# Patient Record
Sex: Female | Born: 1985 | Race: Black or African American | Hispanic: No | Marital: Single | Smoking: Never smoker
Health system: Southern US, Community
[De-identification: ages and names within clinical notes are randomized; demographics above are authoritative.]

## PROBLEM LIST (undated history)

## (undated) DIAGNOSIS — L309 Dermatitis, unspecified: Secondary | ICD-10-CM

## (undated) DIAGNOSIS — J45909 Unspecified asthma, uncomplicated: Secondary | ICD-10-CM

## (undated) HISTORY — PX: LEG SURGERY: SHX1003

## (undated) HISTORY — DX: Dermatitis, unspecified: L30.9

## (undated) HISTORY — DX: Unspecified asthma, uncomplicated: J45.909

---

## 2014-02-07 ENCOUNTER — Emergency Department (HOSPITAL_COMMUNITY)
Admission: EM | Admit: 2014-02-07 | Discharge: 2014-02-07 | Disposition: A | Discharge status: Home / Self Care | Payer: No Typology Code available for payment source | Attending: Emergency Medicine | Admitting: Emergency Medicine

## 2014-02-07 ENCOUNTER — Emergency Department (INDEPENDENT_AMBULATORY_CARE_PROVIDER_SITE_OTHER)
Admission: EM | Admit: 2014-02-07 | Discharge: 2014-02-07 | Disposition: A | Discharge status: Nursing Home (Includes ICF) | Payer: Self-pay | Source: Home / Self Care | Attending: Emergency Medicine | Admitting: Emergency Medicine

## 2014-02-07 ENCOUNTER — Emergency Department (HOSPITAL_COMMUNITY): Payer: No Typology Code available for payment source

## 2014-02-07 ENCOUNTER — Encounter (HOSPITAL_COMMUNITY): Payer: Self-pay | Admitting: Emergency Medicine

## 2014-02-07 DIAGNOSIS — R001 Bradycardia, unspecified: Secondary | ICD-10-CM

## 2014-02-07 DIAGNOSIS — R51 Headache: Secondary | ICD-10-CM

## 2014-02-07 DIAGNOSIS — S1093XA Contusion of unspecified part of neck, initial encounter: Principal | ICD-10-CM

## 2014-02-07 DIAGNOSIS — R110 Nausea: Secondary | ICD-10-CM | POA: Insufficient documentation

## 2014-02-07 DIAGNOSIS — R519 Headache, unspecified: Secondary | ICD-10-CM

## 2014-02-07 DIAGNOSIS — IMO0001 Reserved for inherently not codable concepts without codable children: Secondary | ICD-10-CM | POA: Insufficient documentation

## 2014-02-07 DIAGNOSIS — S0083XA Contusion of other part of head, initial encounter: Principal | ICD-10-CM | POA: Insufficient documentation

## 2014-02-07 DIAGNOSIS — Y939 Activity, unspecified: Secondary | ICD-10-CM | POA: Insufficient documentation

## 2014-02-07 DIAGNOSIS — Y92410 Unspecified street and highway as the place of occurrence of the external cause: Secondary | ICD-10-CM

## 2014-02-07 DIAGNOSIS — S0003XA Contusion of scalp, initial encounter: Secondary | ICD-10-CM | POA: Insufficient documentation

## 2014-02-07 DIAGNOSIS — S0093XA Contusion of unspecified part of head, initial encounter: Secondary | ICD-10-CM

## 2014-02-07 MED ORDER — HYDROCODONE-ACETAMINOPHEN 5-325 MG PO TABS: 1.0000 | ORAL_TABLET | ORAL | Status: AC | PRN

## 2014-02-07 MED ORDER — ONDANSETRON 4 MG PO TBDP
8.0000 mg | ORAL_TABLET | Freq: Once | ORAL | Status: AC
  Administered 2014-02-07: 8 mg via ORAL

## 2014-02-07 MED ORDER — ONDANSETRON 4 MG PO TBDP
ORAL_TABLET | ORAL | Status: AC
  Filled 2014-02-07: qty 2

## 2014-02-07 MED ORDER — NAPROXEN 375 MG PO TABS: 375.0000 mg | ORAL_TABLET | Freq: Two times a day (BID) | ORAL | Status: AC

## 2014-02-07 MED ORDER — CYCLOBENZAPRINE HCL 10 MG PO TABS: 10.0000 mg | ORAL_TABLET | Freq: Two times a day (BID) | ORAL | Status: AC | PRN

## 2014-02-07 NOTE — ED Notes (Signed)
Pt. Ambulated in hall and pulse rechecked. Pulse of 63.
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
## 2014-02-07 NOTE — Discharge Instructions (Signed)
We have determined that your problem requires further evaluation in the emergency department.  We will take care of your transport there.  Once at the emergency department, you will be evaluated by a provider and they will order whatever treatment or tests they deem necessary.  We cannot guarantee that they will do any specific test or do any specific treatment.  ° °
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
## 2014-02-08 NOTE — ED Provider Notes (Signed)
Medical screening examination/treatment/procedure(s) were performed by non-physician practitioner and as supervising physician I was immediately available for consultation/collaboration.   EKG Interpretation None        Esaias Cleavenger B. Indiyah Paone, MD 02/08/14 1542 

## 2014-02-08 NOTE — Progress Notes (Signed)
CM [place] from [place], pharmacists at [facility] on [address], requesting clarification on patient prescriptions [facility]. Read the discharge prescriptions off AVS from [facility]. Ferdinand CavaAndrea Schettino, RN, BSN, Case Managers 02/08/2014 10:29 AM

## 2014-04-17 ENCOUNTER — Ambulatory Visit: Payer: BC Managed Care – PPO | Attending: Orthopaedic Surgery | Admitting: Rehabilitation

## 2014-04-17 DIAGNOSIS — M545 Low back pain, unspecified: Secondary | ICD-10-CM | POA: Diagnosis not present

## 2014-04-17 DIAGNOSIS — IMO0001 Reserved for inherently not codable concepts without codable children: Secondary | ICD-10-CM | POA: Diagnosis present

## 2014-08-11 IMAGING — CT CT HEAD W/O CM
1 series · 16 of 30 positions shown, 20 images · non-contrast
Comparison: None.

HEADACHE CLINICAL DATA:
Trauma

EXAM:
CT HEAD WITHOUT CONTRAST
TECHNIQUE: Contiguous axial images were obtained from the base of the skull
through the vertex without intravenous contrast.

[Series 2: head 5.0 h30s · axial · 0.42mm/px · z∈[+1371,+1506]mm · 16 of 31 slices shown, 20 images]
[im 2/31  brain]
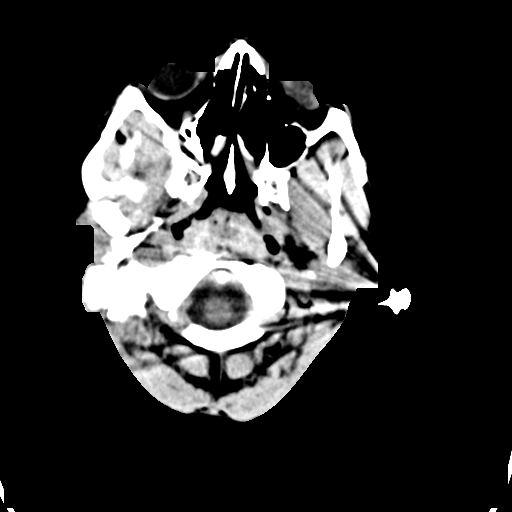
[im 2/31  bone]
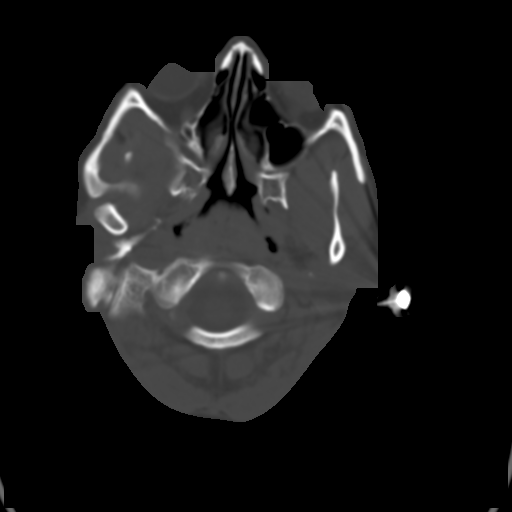
[im 4/31  brain]
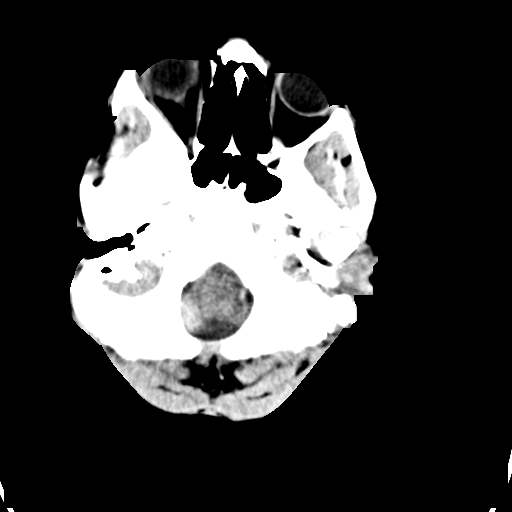
[im 6/31  brain]
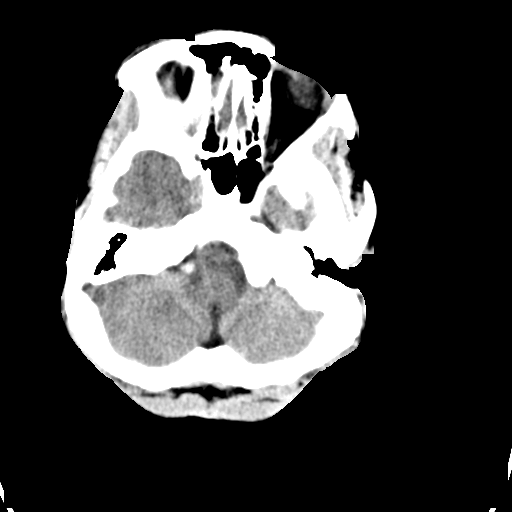
[im 8/31  brain]
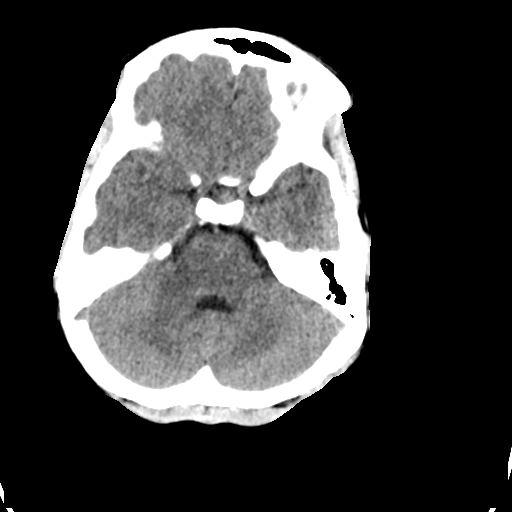
[im 9/31  brain]
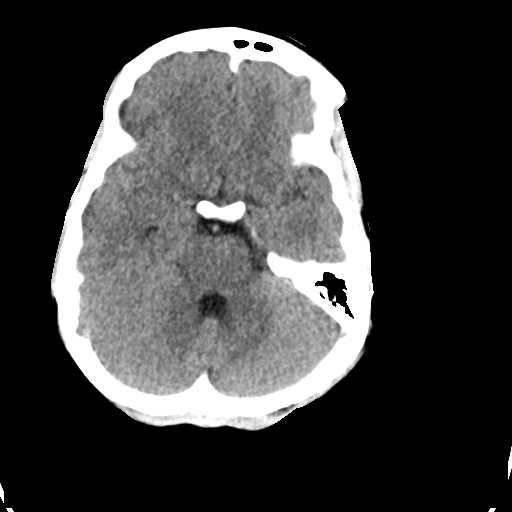
[im 9/31  bone]
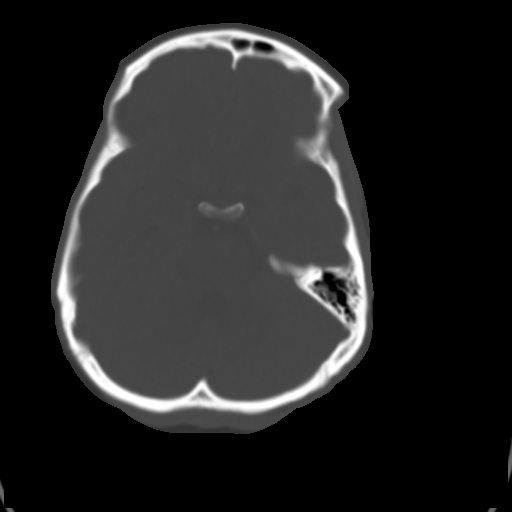
[im 11/31  brain]
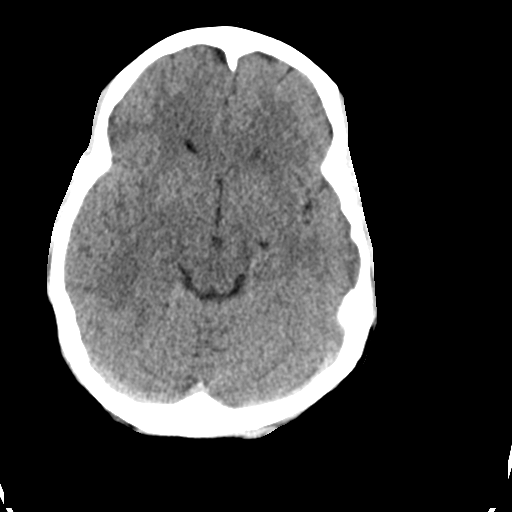
[im 13/31  brain]
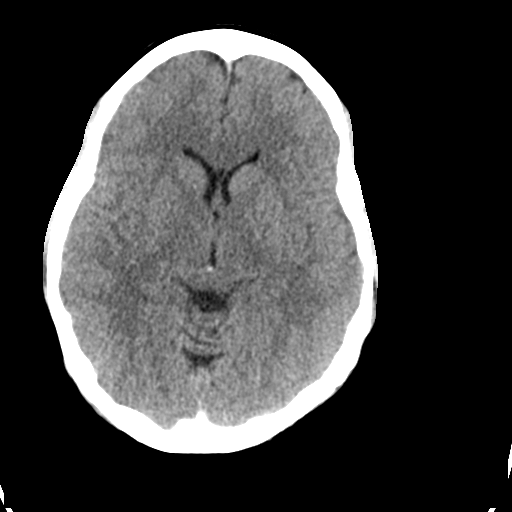
[im 15/31  brain]
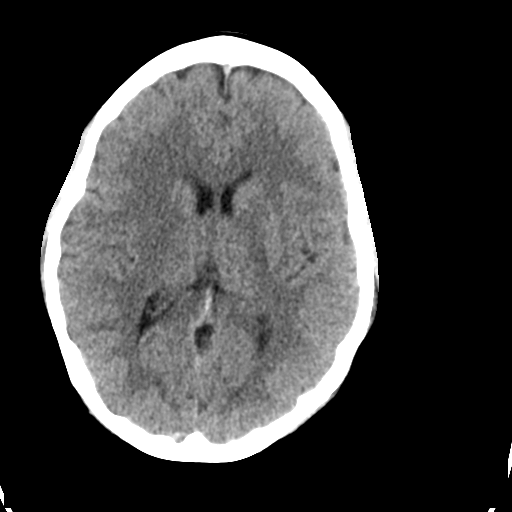
[im 16/31  brain]
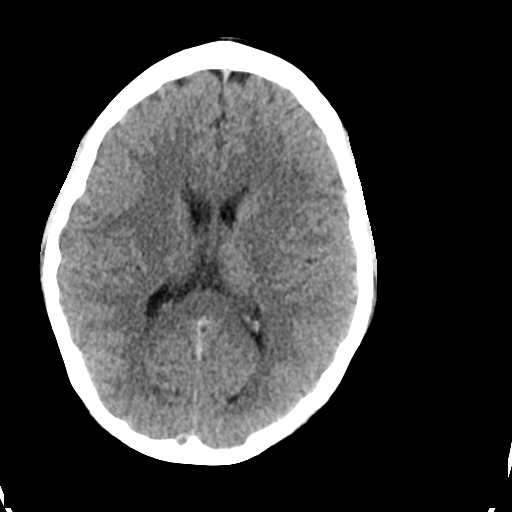
[im 16/31  bone]
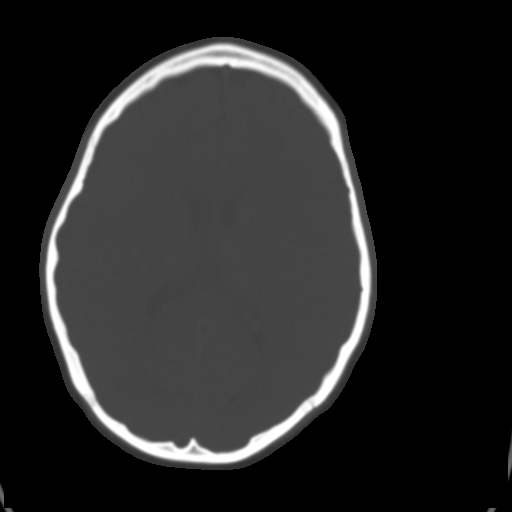
[im 18/31  brain]
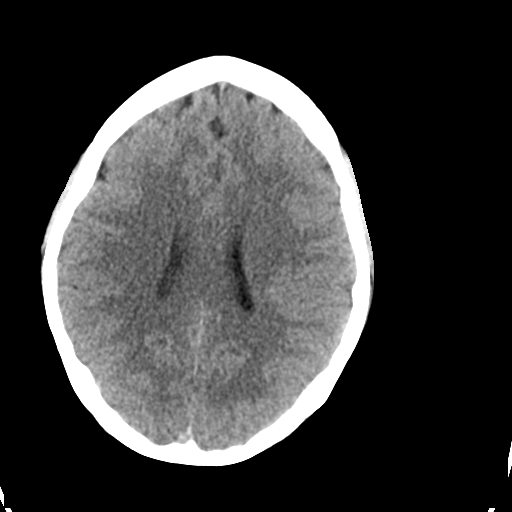
[im 20/31  brain]
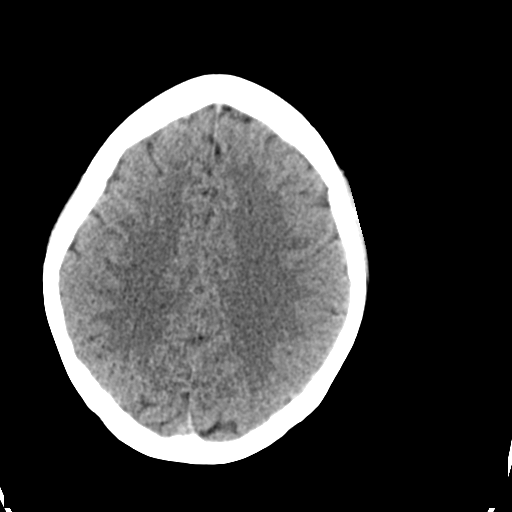
[im 22/31  brain]
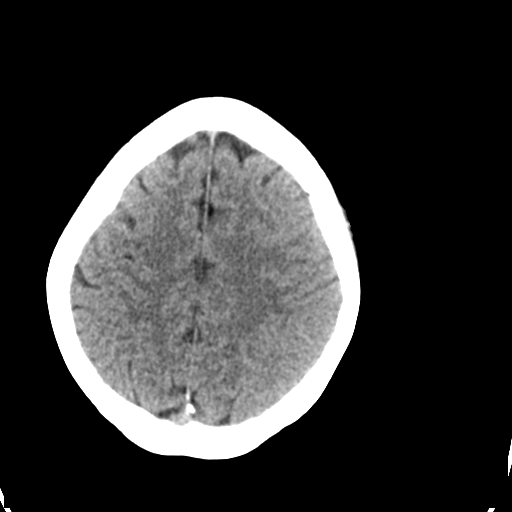
[im 23/31  brain]
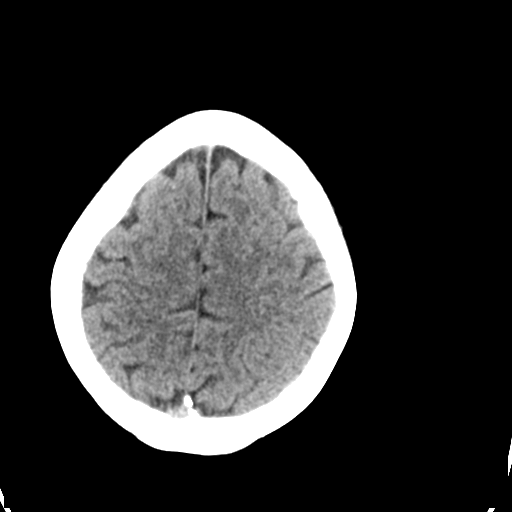
[im 23/31  bone]
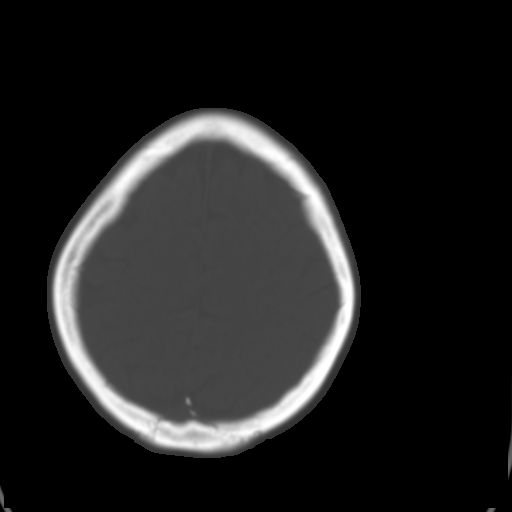
[im 25/31  brain]
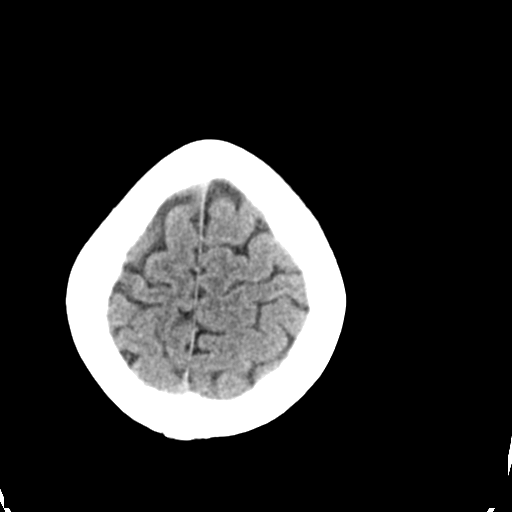
[im 27/31  brain]
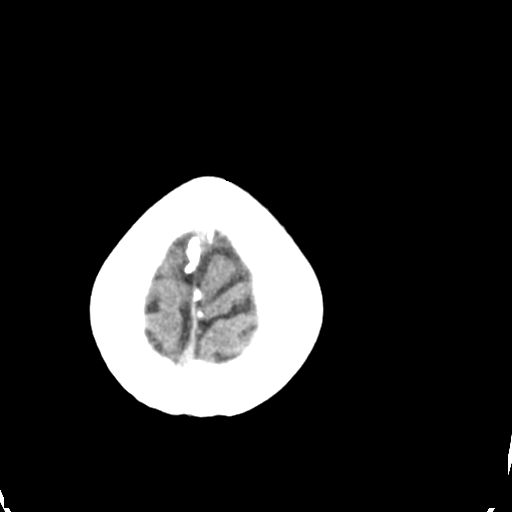
[im 29/31  brain]
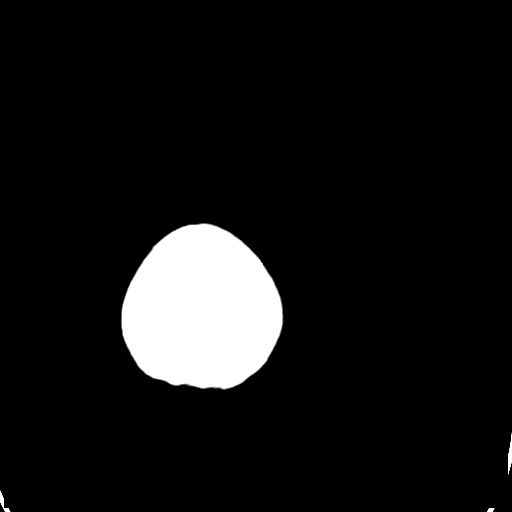

[16 of 30 positions shown; findings below may reference images not displayed]

FINDINGS: The ventricles are normal in size and configuration. There is no
mass, hemorrhage, extra-axial fluid collection, or midline shift.
Gray-white compartments are normal. No acute infarct apparent. Bony
calvarium appears intact. The mastoid air cells are clear.
IMPRESSION: Study within normal limits.

## 2014-10-10 ENCOUNTER — Other Ambulatory Visit (HOSPITAL_COMMUNITY)
Admission: RE | Admit: 2014-10-10 | Discharge: 2014-10-10 | Disposition: A | Discharge status: Home / Self Care | Payer: BC Managed Care – PPO | Source: Ambulatory Visit | Attending: Obstetrics and Gynecology | Admitting: Obstetrics and Gynecology

## 2014-10-10 ENCOUNTER — Other Ambulatory Visit: Payer: Self-pay | Admitting: Obstetrics and Gynecology

## 2014-10-10 DIAGNOSIS — Z113 Encounter for screening for infections with a predominantly sexual mode of transmission: Secondary | ICD-10-CM | POA: Insufficient documentation

## 2014-10-10 DIAGNOSIS — Z01419 Encounter for gynecological examination (general) (routine) without abnormal findings: Secondary | ICD-10-CM | POA: Diagnosis present

## 2014-10-15 LAB — CYTOLOGY - PAP

## 2015-04-15 ENCOUNTER — Other Ambulatory Visit: Payer: Self-pay | Admitting: Family Medicine

## 2015-04-15 DIAGNOSIS — R591 Generalized enlarged lymph nodes: Secondary | ICD-10-CM

## 2015-04-19 ENCOUNTER — Ambulatory Visit
Admission: RE | Admit: 2015-04-19 | Discharge: 2015-04-19 | Disposition: A | Discharge status: Home / Self Care | Payer: BLUE CROSS/BLUE SHIELD | Source: Ambulatory Visit | Attending: Family Medicine | Admitting: Family Medicine

## 2015-04-19 DIAGNOSIS — R591 Generalized enlarged lymph nodes: Secondary | ICD-10-CM

## 2015-04-29 ENCOUNTER — Other Ambulatory Visit: Payer: Self-pay | Admitting: Family Medicine

## 2015-04-29 DIAGNOSIS — R599 Enlarged lymph nodes, unspecified: Secondary | ICD-10-CM

## 2015-05-10 ENCOUNTER — Ambulatory Visit
Admission: RE | Admit: 2015-05-10 | Discharge: 2015-05-10 | Disposition: A | Discharge status: Home / Self Care | Payer: BLUE CROSS/BLUE SHIELD | Source: Ambulatory Visit | Attending: Family Medicine | Admitting: Family Medicine

## 2015-05-10 DIAGNOSIS — R599 Enlarged lymph nodes, unspecified: Secondary | ICD-10-CM

## 2015-06-12 ENCOUNTER — Other Ambulatory Visit: Payer: Self-pay | Admitting: Family Medicine

## 2015-06-12 DIAGNOSIS — E041 Nontoxic single thyroid nodule: Secondary | ICD-10-CM

## 2015-11-11 IMAGING — US US SOFT TISSUE HEAD/NECK
1 series · 13 of 25 positions shown · non-contrast
Comparison: 04/19/2015

CLINICAL DATA: Prominent left cervical lymph node identified near
the thyroid gland.

EXAM:
THYROID ULTRASOUND
TECHNIQUE: Ultrasound examination of the thyroid gland and adjacent soft
tissues was performed.

[Series 1: us soft tissue head/neck · 0.08mm/px · 13 of 43 slices shown]
[im 1/43]
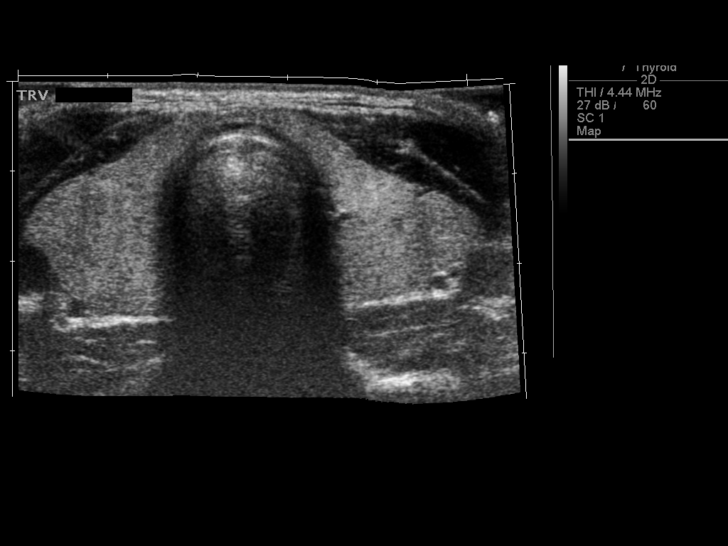
[im 4/43]
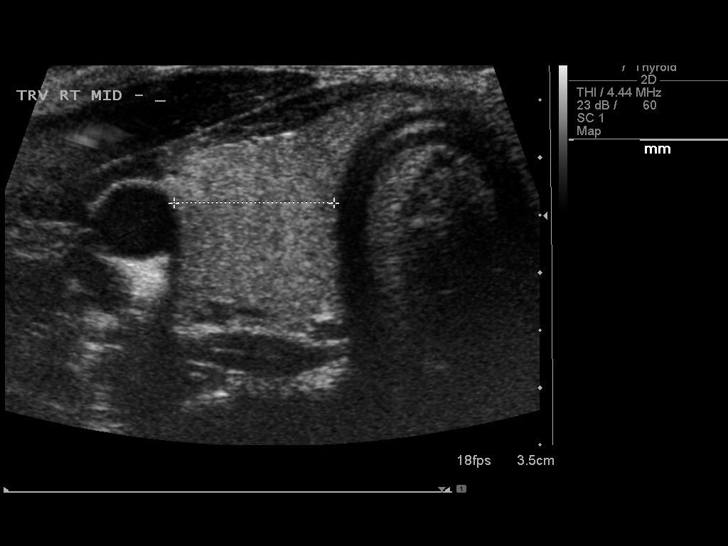
[im 8/43]
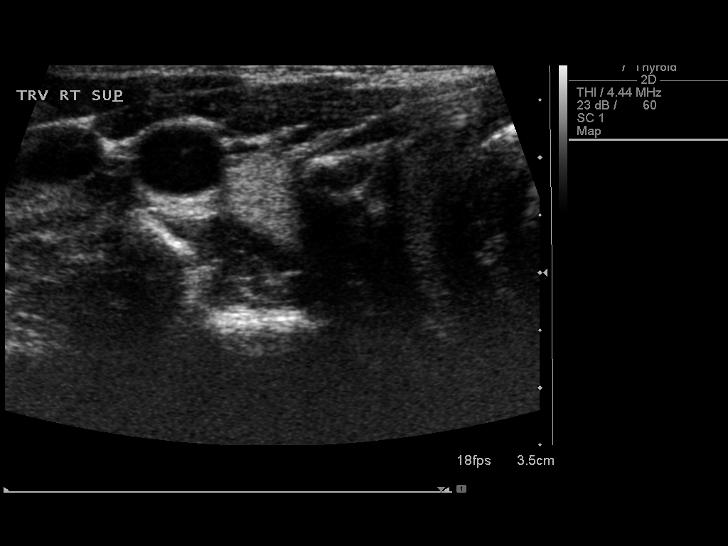
[im 11/43]
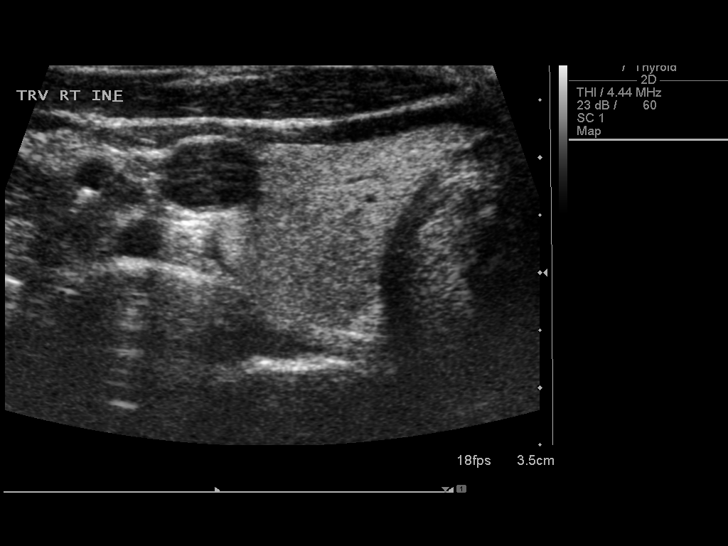
[im 15/43]
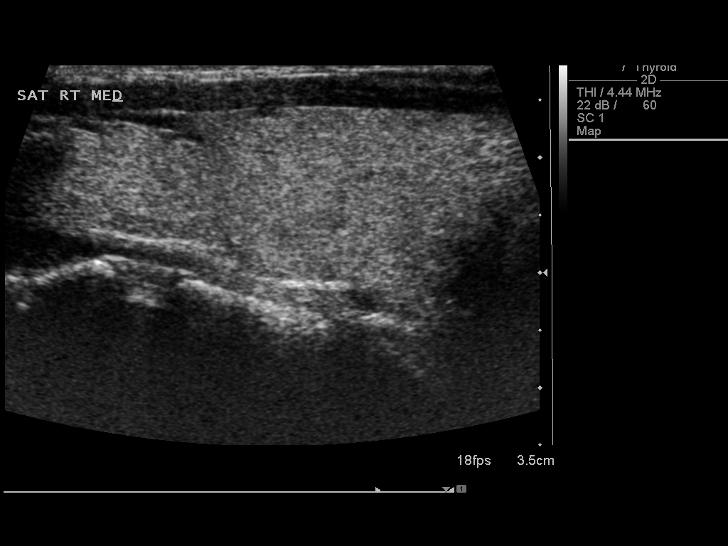
[im 18/43]
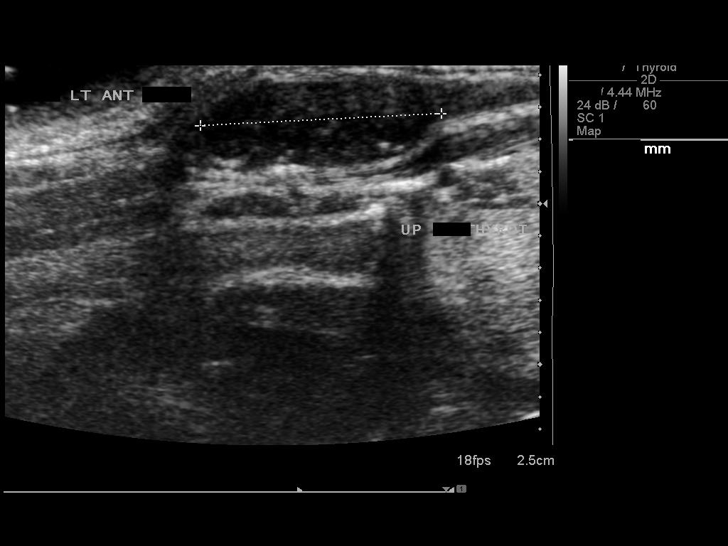
[im 22/43]
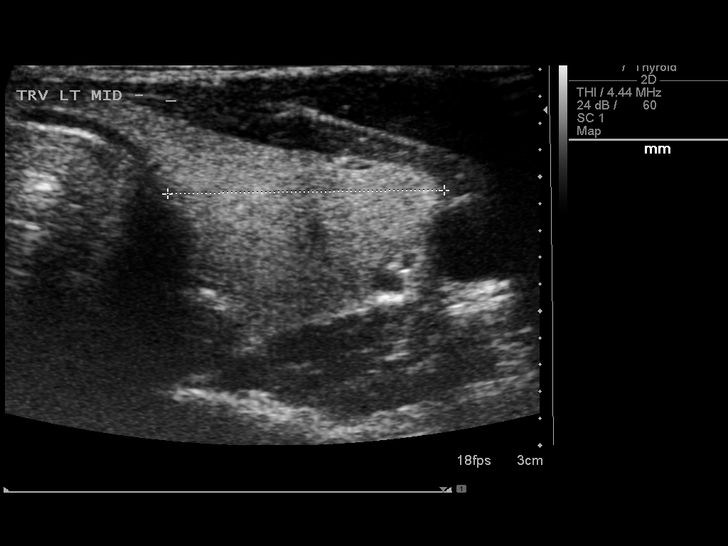
[im 25/43]
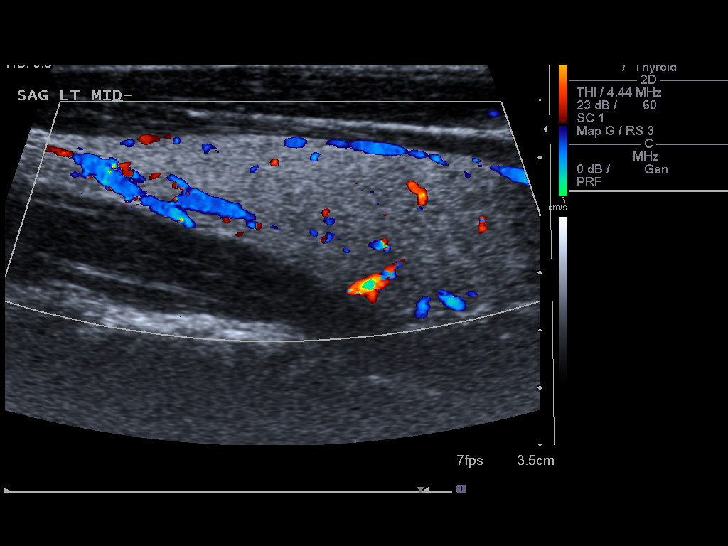
[im 29/43]
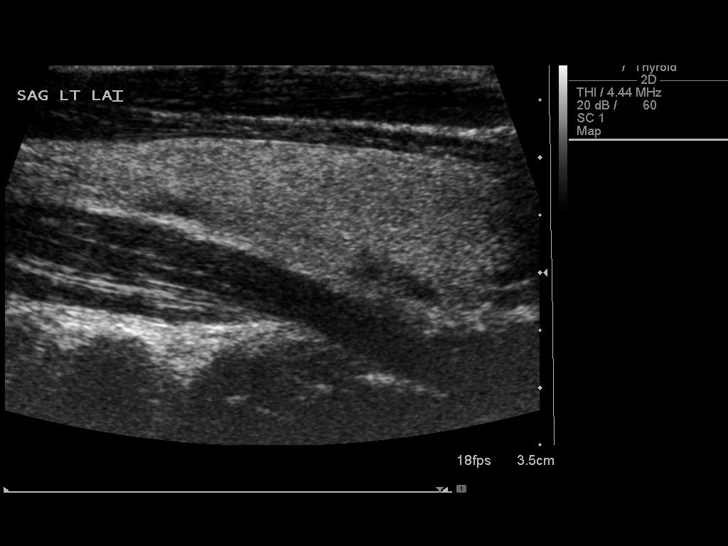
[im 32/43]
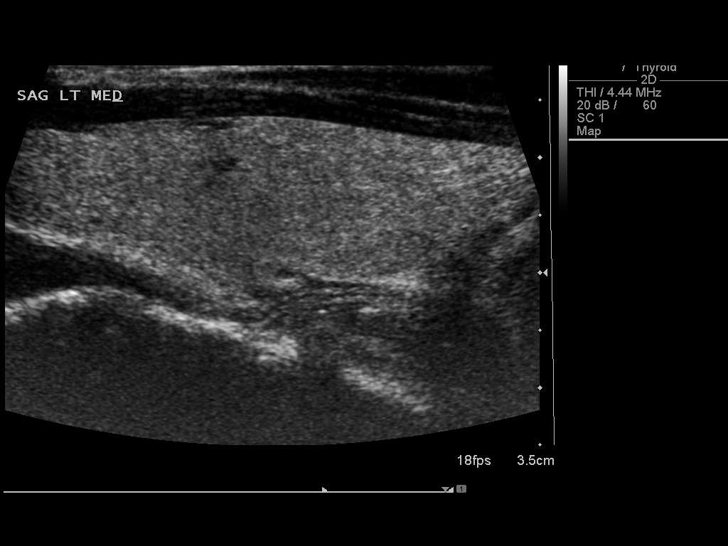
[im 36/43]
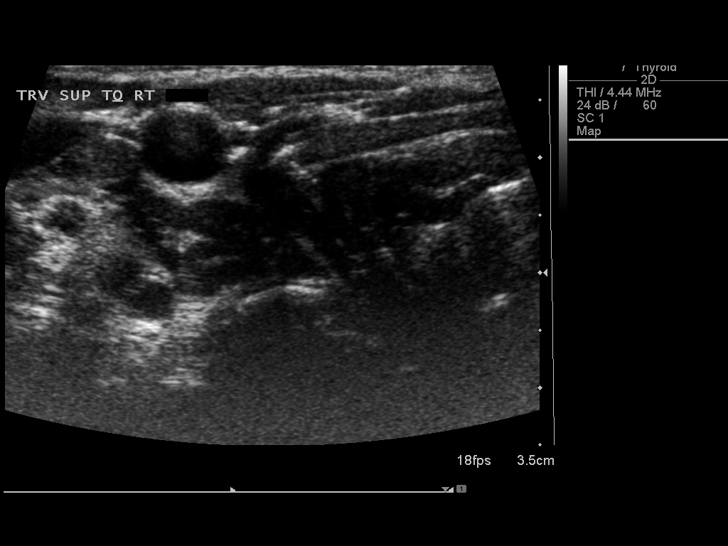
[im 39/43]
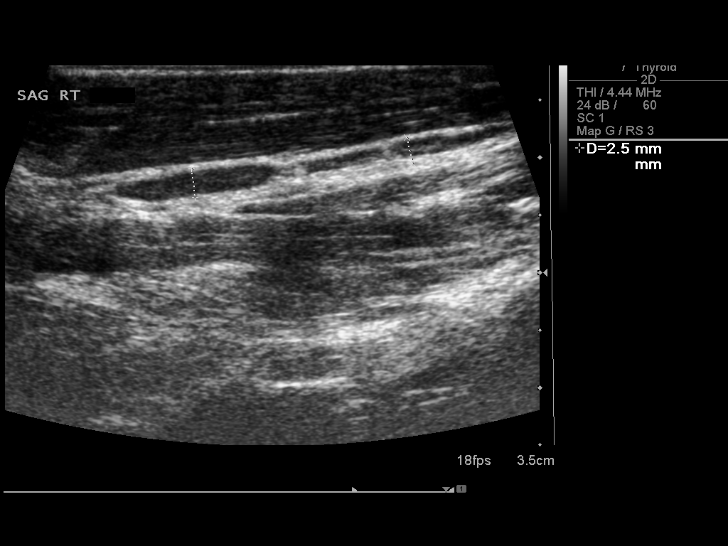
[im 43/43]
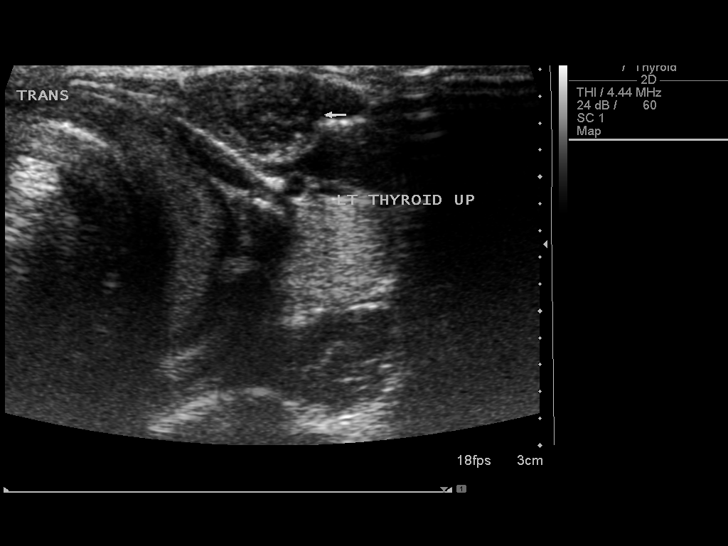

[13 of 25 positions shown; findings below may reference images not displayed]

FINDINGS: Right thyroid lobe

Measurements: 6.0 x 2.0 x 1.4 cm.  No nodules visualized.

Left thyroid lobe

Measurements: 5.8 x 1.3 x 2.1 cm.  No nodules visualized.

Isthmus

Thickness: 0.3 cm. The hypoechoic area in the anterior left neck
appears similar to slightly smaller in size and measures
approximately 1.5 x 0.6 x 1.1 cm (previously 2.7 x 0.7 x 1.7 cm).
This abnormality is not typical by ultrasound of a lymph node and
may represent lymphadenitis, small abscess or complex cyst.

Lymphadenopathy

None visualized.
IMPRESSION: 1. Normal thyroid gland.
2. The previously identified hypoechoic area in the anterior left
neck appears to measure slightly smaller by today's study. This
could be consistent with a resolving inflammatory process such as a
adenitis, small focal abscess or complex cyst. This abnormality does
not have the typical architecture of a lymph node by ultrasound. If
this remains a clinically palpable abnormality or enlarges,
recommend CT of the neck with contrast for further evaluation.

## 2015-11-12 ENCOUNTER — Other Ambulatory Visit (HOSPITAL_COMMUNITY)
Admission: RE | Admit: 2015-11-12 | Discharge: 2015-11-12 | Disposition: A | Discharge status: Home / Self Care | Payer: BLUE CROSS/BLUE SHIELD | Source: Ambulatory Visit | Attending: Obstetrics and Gynecology | Admitting: Obstetrics and Gynecology

## 2015-11-12 ENCOUNTER — Other Ambulatory Visit: Payer: Self-pay | Admitting: Obstetrics and Gynecology

## 2015-11-12 DIAGNOSIS — Z01419 Encounter for gynecological examination (general) (routine) without abnormal findings: Secondary | ICD-10-CM | POA: Insufficient documentation

## 2015-11-12 DIAGNOSIS — Z1151 Encounter for screening for human papillomavirus (HPV): Secondary | ICD-10-CM | POA: Insufficient documentation

## 2015-11-12 DIAGNOSIS — Z113 Encounter for screening for infections with a predominantly sexual mode of transmission: Secondary | ICD-10-CM | POA: Diagnosis present

## 2015-11-15 LAB — CYTOLOGY - PAP

## 2019-11-17 ENCOUNTER — Encounter: Payer: Self-pay | Admitting: Obstetrics & Gynecology

## 2019-11-17 ENCOUNTER — Ambulatory Visit (INDEPENDENT_AMBULATORY_CARE_PROVIDER_SITE_OTHER): Payer: 59 | Admitting: Obstetrics & Gynecology

## 2019-11-17 ENCOUNTER — Other Ambulatory Visit: Payer: Self-pay

## 2019-11-17 VITALS — BP 106/60 | HR 58 | Ht 65.0 in | Wt 128.0 lb

## 2019-11-17 DIAGNOSIS — Z113 Encounter for screening for infections with a predominantly sexual mode of transmission: Secondary | ICD-10-CM

## 2019-11-17 DIAGNOSIS — Z01419 Encounter for gynecological examination (general) (routine) without abnormal findings: Secondary | ICD-10-CM

## 2019-11-17 DIAGNOSIS — R69 Illness, unspecified: Secondary | ICD-10-CM | POA: Diagnosis not present

## 2019-11-17 NOTE — Progress Notes (Signed)
Subjective:     Lisa Shields is a 34 y.o. female here for a routine exam. G1P0010 s/p TAB.   Sexually active- female partner. Monogamous. prior h/o STIs.  Current complaints: h/o HSV. Questions about pregnancy and how it would be affected by this. Also questions about partner and possibility of transmission.   Wears sister locks. Currently working from home.   Gynecologic History No LMP recorded. (Menstrual status: Oral contraceptives). Contraception: condoms Last Pap: 11/12/2015. Results were: normal Last mammogram: n/a.   Obstetric History OB History  Gravida Para Term Preterm AB Living  1       1    SAB TAB Ectopic Multiple Live Births    1          # Outcome Date GA Lbr Len/2nd Weight Sex Delivery Anes PTL Lv  1 TAB 10/19/09           The following portions of the patient's history were reviewed and updated as appropriate: allergies, current medications, past family history, past medical history, past social history, past surgical history and problem list.  Review of Systems Pertinent items are noted in HPI.    Objective:  BP 106/60   Pulse (!) 58   Ht 5\' 5"  (1.651 m)   Wt 128 lb (58.1 kg)   BMI 21.30 kg/m   General Appearance:    Alert, cooperative, no distress, appears stated age  Head:    Normocephalic, without obvious abnormality, atraumatic  Eyes:    conjunctiva/corneas clear, EOM's intact, both eyes  Ears:    Normal external ear canals, both ears  Nose:   Nares normal, septum midline, mucosa normal, no drainage    or sinus tenderness  Throat:   Lips, mucosa, and tongue normal; teeth and gums normal  Neck:   Supple, symmetrical, trachea midline, no adenopathy;    thyroid:  no enlargement/tenderness/nodules  Back:     Symmetric, no curvature, ROM normal, no CVA tenderness  Lungs:     respirations unlabored  Chest Wall:    No tenderness or deformity   Heart:    Regular rate and rhythm  Breast Exam:    No tenderness, masses, or nipple abnormality  Abdomen:      Soft, non-tender, bowel sounds active all four quadrants,    no masses, no organomegaly  Genitalia:    Normal female without lesion, discharge or tenderness     Extremities:   Extremities normal, atraumatic, no cyanosis or edema  Pulses:   2+ and symmetric all extremities  Skin:   Skin color, texture, turgor normal, no rashes or lesions    Assessment:    Healthy female exam.   H/o HSV Requests STI screen - no recent exposure.    Plan:   F/u PAP with hrHPV STI screen serum and cx F/u in 1 year or sooner prn  PNV if she decides to stop using condoms.   Efton Thomley L. Harraway-Smith, M.D., 

## 2019-11-18 LAB — HEPATITIS C ANTIBODY: Hep C Virus Ab: <0.1 s/co ratio (ref 0.0–0.9)

## 2019-11-18 LAB — HEPATITIS B SURFACE ANTIGEN: Hepatitis B Surface Ag: NEGATIVE

## 2019-11-18 LAB — HIV ANTIBODY (ROUTINE TESTING W REFLEX): HIV Screen 4th Generation wRfx: NONREACTIVE

## 2019-11-22 LAB — CYTOLOGY - PAP
Chlamydia: NEGATIVE
Comment: NEGATIVE
Comment: NEGATIVE
Comment: NORMAL
Diagnosis: NEGATIVE
High risk HPV: NEGATIVE
Neisseria Gonorrhea: NEGATIVE
